# Patient Record
Sex: Female | Born: 1970 | Race: Black or African American | Hispanic: No | Smoking: Former smoker
Health system: Southern US, Community
[De-identification: ages and names within clinical notes are randomized; demographics above are authoritative.]

## PROBLEM LIST (undated history)

## (undated) DIAGNOSIS — F329 Major depressive disorder, single episode, unspecified: Secondary | ICD-10-CM

## (undated) DIAGNOSIS — Z5320 Procedure and treatment not carried out because of patient's decision for unspecified reasons: Secondary | ICD-10-CM

## (undated) DIAGNOSIS — N938 Other specified abnormal uterine and vaginal bleeding: Secondary | ICD-10-CM

## (undated) DIAGNOSIS — E663 Overweight: Secondary | ICD-10-CM

## (undated) DIAGNOSIS — D500 Iron deficiency anemia secondary to blood loss (chronic): Secondary | ICD-10-CM

## (undated) DIAGNOSIS — I10 Essential (primary) hypertension: Secondary | ICD-10-CM

## (undated) DIAGNOSIS — E785 Hyperlipidemia, unspecified: Secondary | ICD-10-CM

## (undated) DIAGNOSIS — Z87891 Personal history of nicotine dependence: Secondary | ICD-10-CM

## (undated) DIAGNOSIS — F32A Depression, unspecified: Secondary | ICD-10-CM

## (undated) HISTORY — DX: Iron deficiency anemia secondary to blood loss (chronic): D50.0

## (undated) HISTORY — DX: Personal history of nicotine dependence: Z87.891

## (undated) HISTORY — DX: Essential (primary) hypertension: I10

## (undated) HISTORY — DX: Procedure and treatment not carried out because of patient's decision for unspecified reasons: Z53.20

## (undated) HISTORY — DX: Overweight: E66.3

## (undated) HISTORY — DX: Hyperlipidemia, unspecified: E78.5

---

## 2015-10-02 DIAGNOSIS — N852 Hypertrophy of uterus: Secondary | ICD-10-CM | POA: Insufficient documentation

## 2015-10-02 DIAGNOSIS — N921 Excessive and frequent menstruation with irregular cycle: Secondary | ICD-10-CM | POA: Insufficient documentation

## 2015-12-31 ENCOUNTER — Inpatient Hospital Stay (HOSPITAL_COMMUNITY)
Admission: AD | Admit: 2015-12-31 | Discharge: 2016-01-04 | DRG: 065 | Disposition: A | Discharge status: Home Health | Payer: Medicaid Other | Source: Other Acute Inpatient Hospital | Attending: Internal Medicine | Admitting: Internal Medicine

## 2015-12-31 DIAGNOSIS — N938 Other specified abnormal uterine and vaginal bleeding: Secondary | ICD-10-CM | POA: Diagnosis present

## 2015-12-31 DIAGNOSIS — F329 Major depressive disorder, single episode, unspecified: Secondary | ICD-10-CM | POA: Diagnosis present

## 2015-12-31 DIAGNOSIS — I639 Cerebral infarction, unspecified: Secondary | ICD-10-CM | POA: Diagnosis present

## 2015-12-31 DIAGNOSIS — F17210 Nicotine dependence, cigarettes, uncomplicated: Secondary | ICD-10-CM | POA: Diagnosis present

## 2015-12-31 DIAGNOSIS — E876 Hypokalemia: Secondary | ICD-10-CM | POA: Diagnosis present

## 2015-12-31 DIAGNOSIS — R531 Weakness: Secondary | ICD-10-CM | POA: Diagnosis present

## 2015-12-31 DIAGNOSIS — D259 Leiomyoma of uterus, unspecified: Secondary | ICD-10-CM | POA: Diagnosis present

## 2015-12-31 DIAGNOSIS — R4701 Aphasia: Secondary | ICD-10-CM | POA: Diagnosis present

## 2015-12-31 DIAGNOSIS — G8191 Hemiplegia, unspecified affecting right dominant side: Secondary | ICD-10-CM | POA: Diagnosis present

## 2015-12-31 DIAGNOSIS — I6340 Cerebral infarction due to embolism of unspecified cerebral artery: Secondary | ICD-10-CM

## 2015-12-31 DIAGNOSIS — I10 Essential (primary) hypertension: Secondary | ICD-10-CM | POA: Diagnosis present

## 2015-12-31 DIAGNOSIS — E785 Hyperlipidemia, unspecified: Secondary | ICD-10-CM | POA: Diagnosis present

## 2015-12-31 DIAGNOSIS — I6789 Other cerebrovascular disease: Secondary | ICD-10-CM | POA: Diagnosis not present

## 2015-12-31 DIAGNOSIS — I671 Cerebral aneurysm, nonruptured: Secondary | ICD-10-CM | POA: Diagnosis present

## 2015-12-31 DIAGNOSIS — D500 Iron deficiency anemia secondary to blood loss (chronic): Secondary | ICD-10-CM | POA: Insufficient documentation

## 2015-12-31 DIAGNOSIS — R29704 NIHSS score 4: Secondary | ICD-10-CM | POA: Diagnosis present

## 2015-12-31 DIAGNOSIS — F4321 Adjustment disorder with depressed mood: Secondary | ICD-10-CM | POA: Diagnosis present

## 2015-12-31 DIAGNOSIS — I63412 Cerebral infarction due to embolism of left middle cerebral artery: Secondary | ICD-10-CM | POA: Diagnosis present

## 2015-12-31 DIAGNOSIS — D509 Iron deficiency anemia, unspecified: Secondary | ICD-10-CM | POA: Diagnosis present

## 2015-12-31 DIAGNOSIS — I63132 Cerebral infarction due to embolism of left carotid artery: Secondary | ICD-10-CM | POA: Diagnosis not present

## 2015-12-31 HISTORY — DX: Major depressive disorder, single episode, unspecified: F32.9

## 2015-12-31 HISTORY — DX: Depression, unspecified: F32.A

## 2015-12-31 HISTORY — DX: Other specified abnormal uterine and vaginal bleeding: N93.8

## 2015-12-31 LAB — CBC WITH DIFFERENTIAL/PLATELET
BASOS PCT: 0 %
Basophils Absolute: 0 10*3/uL (ref 0.0–0.1)
EOS ABS: 0.1 10*3/uL (ref 0.0–0.7)
EOS PCT: 1 %
HCT: 28.4 % — ABNORMAL LOW (ref 36.0–46.0)
HEMOGLOBIN: 8.3 g/dL — AB (ref 12.0–15.0)
LYMPHS PCT: 17 %
Lymphs Abs: 1.4 10*3/uL (ref 0.7–4.0)
MCH: 20.4 pg — AB (ref 26.0–34.0)
MCHC: 29.2 g/dL — AB (ref 30.0–36.0)
MCV: 69.8 fL — AB (ref 78.0–100.0)
Monocytes Absolute: 0.4 10*3/uL (ref 0.1–1.0)
Monocytes Relative: 5 %
NEUTROS ABS: 6.6 10*3/uL (ref 1.7–7.7)
NEUTROS PCT: 77 %
Platelets: 336 10*3/uL (ref 150–400)
RBC: 4.07 MIL/uL (ref 3.87–5.11)
RDW: 20 % — ABNORMAL HIGH (ref 11.5–15.5)
WBC: 8.5 10*3/uL (ref 4.0–10.5)

## 2015-12-31 LAB — TYPE AND SCREEN
ABO/RH(D): B POS
Antibody Screen: NEGATIVE

## 2015-12-31 LAB — RETICULOCYTES
RBC.: 4.07 MIL/uL (ref 3.87–5.11)
RETIC COUNT ABSOLUTE: 32.6 10*3/uL (ref 19.0–186.0)
RETIC CT PCT: 0.8 % (ref 0.4–3.1)

## 2015-12-31 LAB — COMPREHENSIVE METABOLIC PANEL
ALBUMIN: 3.2 g/dL — AB (ref 3.5–5.0)
ALK PHOS: 59 U/L (ref 38–126)
ALT: 14 U/L (ref 14–54)
ANION GAP: 6 (ref 5–15)
AST: 16 U/L (ref 15–41)
BUN: 7 mg/dL (ref 6–20)
CALCIUM: 8.4 mg/dL — AB (ref 8.9–10.3)
CHLORIDE: 108 mmol/L (ref 101–111)
CO2: 22 mmol/L (ref 22–32)
CREATININE: 0.66 mg/dL (ref 0.44–1.00)
GFR calc non Af Amer: >60 mL/min (ref >60)
GLUCOSE: 86 mg/dL (ref 65–99)
Potassium: 3.1 mmol/L — ABNORMAL LOW (ref 3.5–5.1)
SODIUM: 136 mmol/L (ref 135–145)
Total Bilirubin: 1.3 mg/dL — ABNORMAL HIGH (ref 0.3–1.2)
Total Protein: 6.5 g/dL (ref 6.5–8.1)

## 2015-12-31 LAB — PROTIME-INR
INR: 1.11 (ref 0.00–1.49)
PROTHROMBIN TIME: 14.5 s (ref 11.6–15.2)

## 2016-01-01 ENCOUNTER — Inpatient Hospital Stay (HOSPITAL_COMMUNITY): Payer: Medicaid Other

## 2016-01-01 ENCOUNTER — Encounter (HOSPITAL_COMMUNITY): Payer: Self-pay | Admitting: Internal Medicine

## 2016-01-01 DIAGNOSIS — D509 Iron deficiency anemia, unspecified: Secondary | ICD-10-CM

## 2016-01-01 DIAGNOSIS — I6349 Cerebral infarction due to embolism of other cerebral artery: Secondary | ICD-10-CM

## 2016-01-01 DIAGNOSIS — I6789 Other cerebrovascular disease: Secondary | ICD-10-CM

## 2016-01-01 DIAGNOSIS — I63412 Cerebral infarction due to embolism of left middle cerebral artery: Secondary | ICD-10-CM

## 2016-01-01 DIAGNOSIS — I639 Cerebral infarction, unspecified: Secondary | ICD-10-CM

## 2016-01-01 HISTORY — DX: Cerebral infarction, unspecified: I63.9

## 2016-01-01 HISTORY — DX: Cerebral infarction due to embolism of left middle cerebral artery: I63.412

## 2016-01-01 HISTORY — DX: Iron deficiency anemia, unspecified: D50.9

## 2016-01-01 LAB — ABO/RH: ABO/RH(D): B POS

## 2016-01-01 LAB — LIPID PANEL
CHOL/HDL RATIO: 3.1 ratio
Cholesterol: 149 mg/dL (ref 0–200)
HDL: 48 mg/dL (ref >40)
LDL Cholesterol: 78 mg/dL (ref 0–99)
Triglycerides: 115 mg/dL (ref <150)
VLDL: 23 mg/dL (ref 0–40)

## 2016-01-01 LAB — SEDIMENTATION RATE: Sed Rate: 8 mm/hr (ref 0–22)

## 2016-01-01 LAB — IRON AND TIBC
Iron: 78 ug/dL (ref 28–170)
SATURATION RATIOS: 15 % (ref 10.4–31.8)
TIBC: 532 ug/dL — AB (ref 250–450)
UIBC: 454 ug/dL

## 2016-01-01 LAB — GLUCOSE, CAPILLARY: Glucose-Capillary: 121 mg/dL — ABNORMAL HIGH (ref 65–99)

## 2016-01-01 LAB — URINALYSIS, ROUTINE W REFLEX MICROSCOPIC
Bilirubin Urine: NEGATIVE
GLUCOSE, UA: NEGATIVE mg/dL
Hgb urine dipstick: NEGATIVE
KETONES UR: 15 mg/dL — AB
LEUKOCYTES UA: NEGATIVE
NITRITE: NEGATIVE
PH: 6 (ref 5.0–8.0)
Protein, ur: 30 mg/dL — AB
SPECIFIC GRAVITY, URINE: 1.023 (ref 1.005–1.030)

## 2016-01-01 LAB — C-REACTIVE PROTEIN: CRP: 1 mg/dL — ABNORMAL HIGH (ref <1.0)

## 2016-01-01 LAB — PREGNANCY, URINE: Preg Test, Ur: NEGATIVE

## 2016-01-01 LAB — FERRITIN: Ferritin: 4 ng/mL — ABNORMAL LOW (ref 11–307)

## 2016-01-01 LAB — URINE MICROSCOPIC-ADD ON

## 2016-01-01 LAB — RAPID URINE DRUG SCREEN, HOSP PERFORMED
Amphetamines: NOT DETECTED
BENZODIAZEPINES: NOT DETECTED
Barbiturates: NOT DETECTED
Cocaine: NOT DETECTED
Opiates: NOT DETECTED
Tetrahydrocannabinol: NOT DETECTED

## 2016-01-01 LAB — FOLATE: FOLATE: 17.6 ng/mL (ref >5.9)

## 2016-01-01 LAB — ECHOCARDIOGRAM COMPLETE: WEIGHTICAEL: 2546.75 [oz_av]

## 2016-01-01 LAB — VITAMIN B12: VITAMIN B 12: 448 pg/mL (ref 180–914)

## 2016-01-01 IMAGING — CR DG CHEST 2V
2 series · 2 of 2 positions shown · non-contrast
Comparison: [DATE]

CLINICAL DATA: Weakness following CVA

EXAM:
CHEST  2 VIEW

[chest lat]
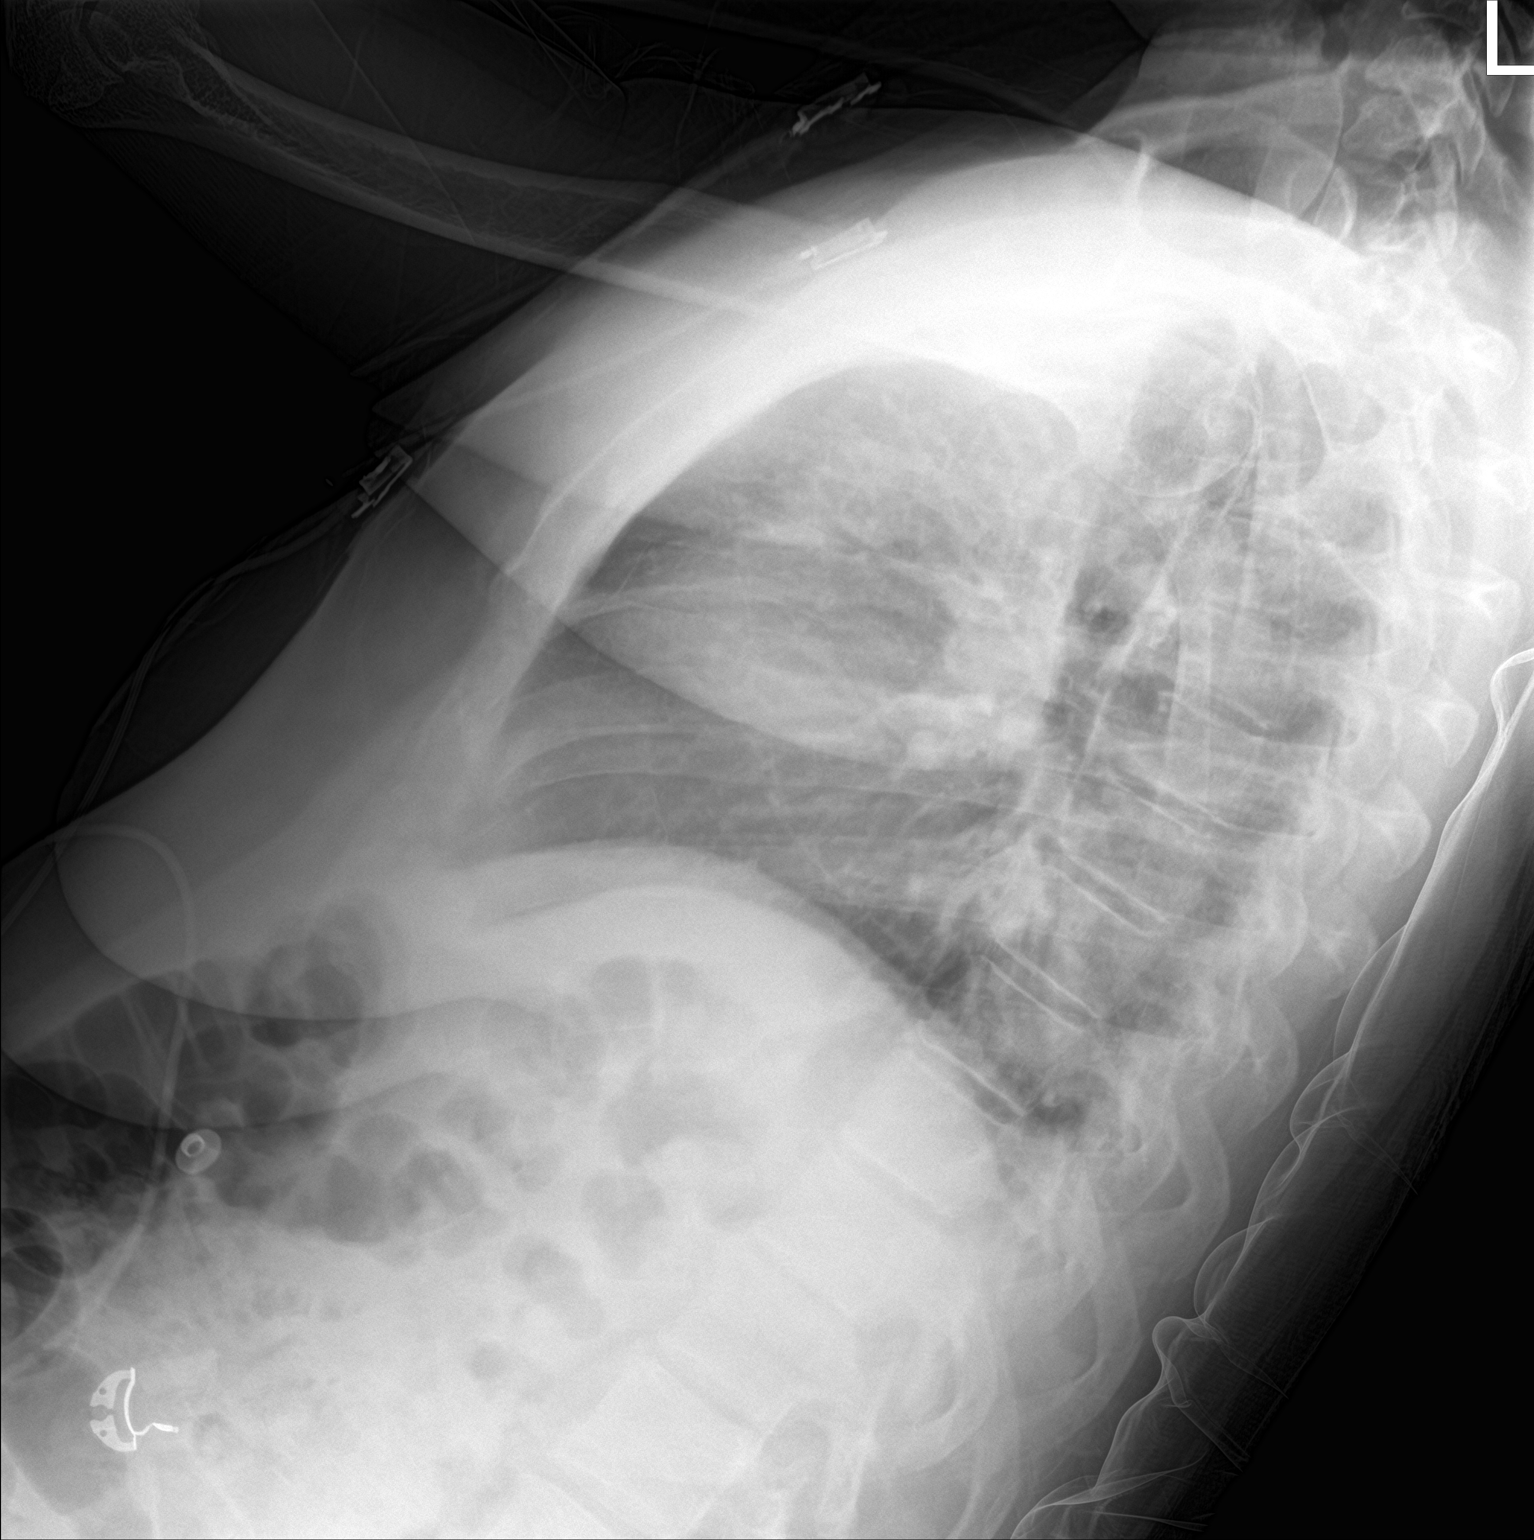

[chest ap]
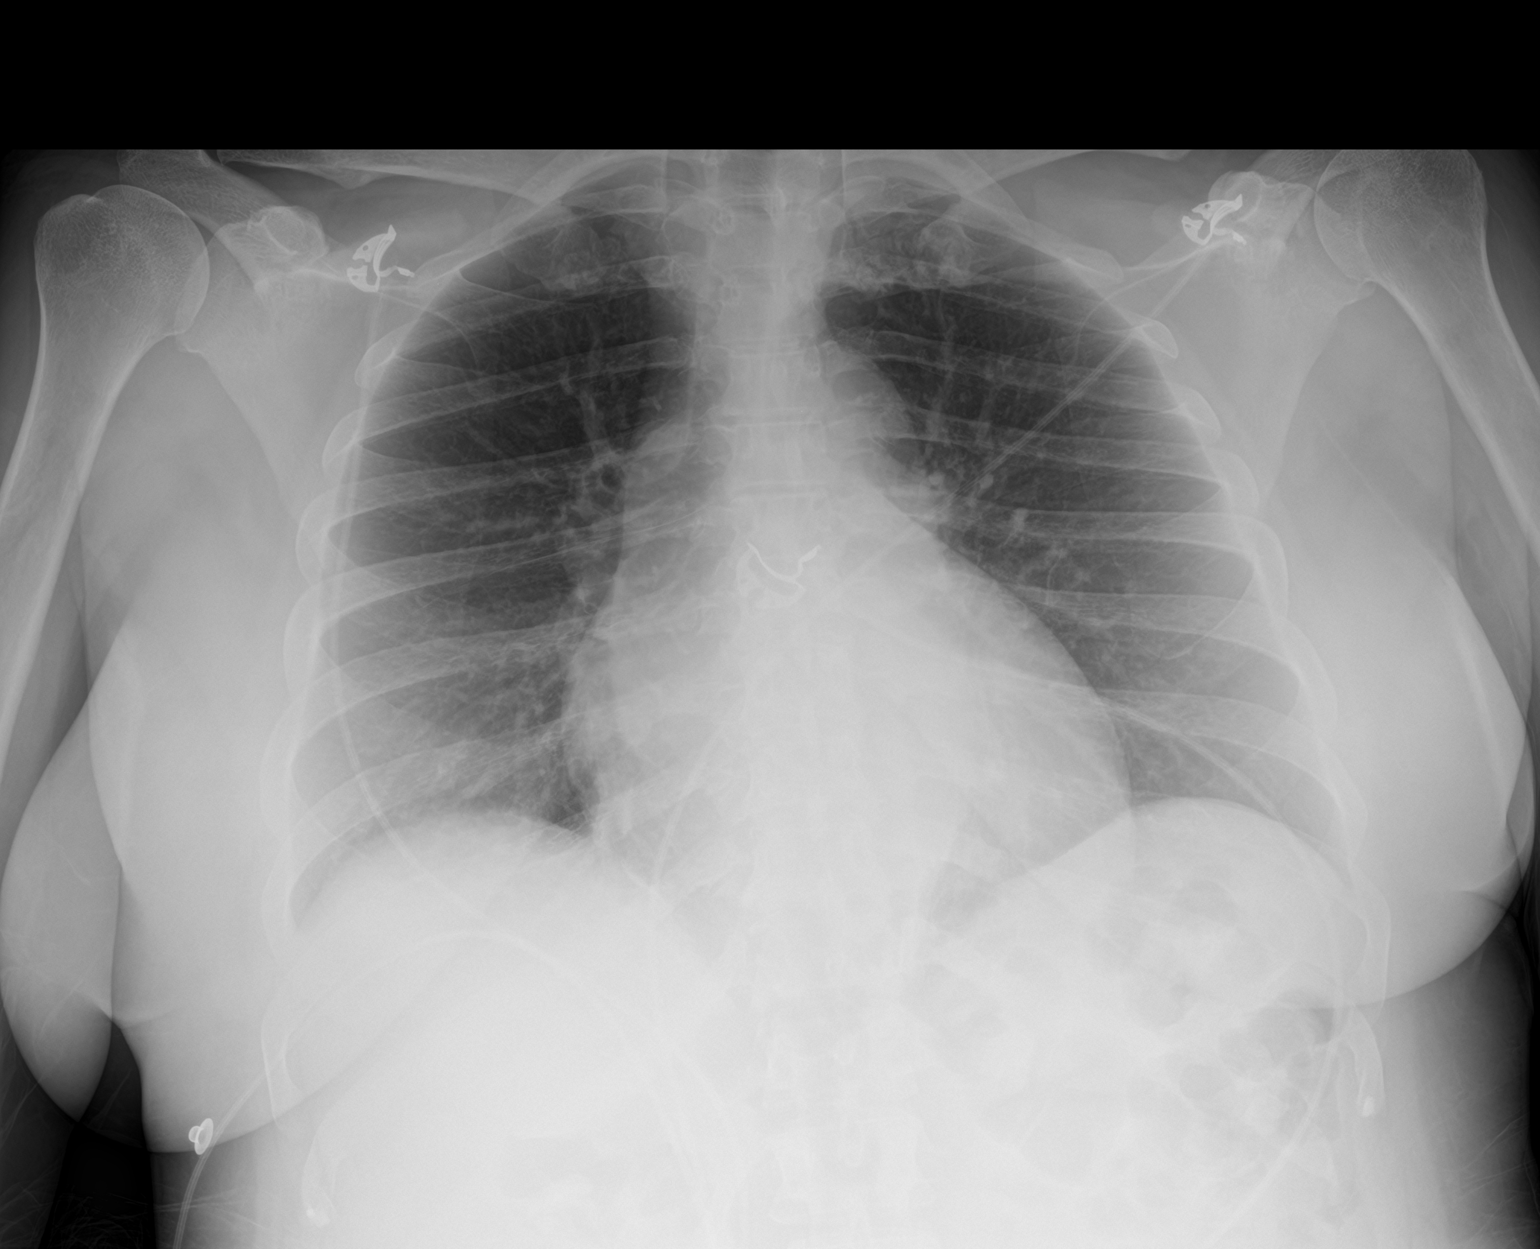

[2 of 2 positions shown; findings below may reference images not displayed]

FINDINGS: There is no edema or consolidation. Heart is upper normal in size
with pulmonary vascularity within normal limits. No adenopathy. No
bone lesions.
IMPRESSION: No edema or consolidation.

## 2016-01-01 MED ORDER — FERROUS SULFATE 325 (65 FE) MG PO TABS
325.0000 mg | ORAL_TABLET | Freq: Two times a day (BID) | ORAL | Status: DC
  Administered 2016-01-02 – 2016-01-04 (×5): 325 mg via ORAL
  Filled 2016-01-01 (×5): qty 1

## 2016-01-01 MED ORDER — IOPAMIDOL (ISOVUE-370) INJECTION 76%
INTRAVENOUS | Status: AC
Start: 2016-01-01 — End: 2016-01-01
  Administered 2016-01-01: 50 mL
  Filled 2016-01-01: qty 50

## 2016-01-01 MED ORDER — ATORVASTATIN CALCIUM 10 MG PO TABS
10.0000 mg | ORAL_TABLET | Freq: Every day | ORAL | Status: DC
  Administered 2016-01-01 – 2016-01-03 (×3): 10 mg via ORAL
  Filled 2016-01-01 (×3): qty 1

## 2016-01-01 MED ORDER — STROKE: EARLY STAGES OF RECOVERY BOOK
Freq: Once | Status: AC
  Administered 2016-01-01: 03:00:00

## 2016-01-01 MED ORDER — DOCUSATE SODIUM 100 MG PO CAPS
100.0000 mg | ORAL_CAPSULE | Freq: Two times a day (BID) | ORAL | Status: DC
Start: 2016-01-01 — End: 2016-01-04
  Administered 2016-01-01 – 2016-01-04 (×4): 100 mg via ORAL
  Filled 2016-01-01 (×5): qty 1

## 2016-01-01 MED ORDER — SODIUM CHLORIDE 0.9 % IV SOLN
INTRAVENOUS | Status: AC
  Administered 2016-01-01: 03:00:00 via INTRAVENOUS

## 2016-01-01 MED ORDER — ASPIRIN 325 MG PO TABS
325.0000 mg | ORAL_TABLET | Freq: Every day | ORAL | Status: DC
  Administered 2016-01-01 – 2016-01-04 (×4): 325 mg via ORAL
  Filled 2016-01-01 (×4): qty 1

## 2016-01-01 MED ORDER — HEPARIN (PORCINE) IN NACL 100-0.45 UNIT/ML-% IJ SOLN
1100.0000 [IU]/h | INTRAMUSCULAR | Status: DC
  Administered 2016-01-01: 900 [IU]/h via INTRAVENOUS
  Administered 2016-01-02: 1100 [IU]/h via INTRAVENOUS
  Filled 2016-01-01 (×3): qty 250

## 2016-01-01 MED ORDER — POTASSIUM CHLORIDE CRYS ER 20 MEQ PO TBCR
40.0000 meq | EXTENDED_RELEASE_TABLET | Freq: Once | ORAL | Status: AC
  Administered 2016-01-01: 40 meq via ORAL
  Filled 2016-01-01: qty 2

## 2016-01-01 MED ORDER — ASPIRIN 300 MG RE SUPP: 300.0000 mg | Freq: Every day | RECTAL | Status: DC
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
## 2016-01-01 NOTE — Progress Notes (Signed)
Pt arrived via [place] from [facility]. Pt drowsy but responds to voice. Patient has expressive aphasia but able to follow commands. Teley applied. Q2 neuro checks and vitals started. Pt oriented to room and call bell.

## 2016-01-01 NOTE — Progress Notes (Signed)
  Echocardiogram 2D Echocardiogram has been performed.  Bobbye Charleston 01/01/2016, 10:15 AM
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
## 2016-01-01 NOTE — Progress Notes (Signed)
[facility] has been requested to perform a transesophageal echocardiogram on [name] for stroke.  After careful review of history and examination, the risks and benefits of transesophageal echocardiogram have been explained including risks of esophageal damage, perforation (1:10,000 risk), bleeding, pharyngeal hematoma as well as other potential complications associated with conscious sedation including aspiration, arrhythmia, respiratory failure and death. Alternatives to treatment were discussed, questions were answered. Patient is willing to proceed.  TEE - Dr. Harrington Challenger  @ 14:00 on 01/02/16 . NPO after midnight. Meds with sips ok.   Arbutus Leas, NP 01/01/2016 4:06 PM
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
## 2016-01-01 NOTE — Progress Notes (Signed)
   01/01/16 1412  Clinical Encounter Type  Visited With Patient;Patient not available;Health care provider  Visit Type Initial  Referral From Nurse   Chaplain responded to a consult. Patient has just returned from CT and is tired. Chaplain offered and will try to return later this afternoon or tomorrow. Spiritual care services available as needed.   Jeri Lager, Chaplain  01/01/2016 2:13 PM
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
## 2016-01-01 NOTE — Progress Notes (Signed)
TEE information given. Consent signed and in chart. Pt to be NPO after MN.  Ave Filter, RN
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
## 2016-01-01 NOTE — Progress Notes (Signed)
   01/01/16 1601  Clinical Encounter Type  Visited With Patient and family together;Health care provider  Visit Type Follow-up  Referral From Nurse   Chaplain provided follow-up care. Patient is now awake and alert, but she is visiting with family. Chaplain introduced spiritual care services. Spiritual care services available as needed.   Jeri Lager, Chaplain 01/01/2016 4:02 PM

## 2016-01-02 ENCOUNTER — Encounter (HOSPITAL_COMMUNITY)
Admission: AD | Disposition: A | Discharge status: Home Health | Payer: Self-pay | Source: Other Acute Inpatient Hospital | Attending: Internal Medicine

## 2016-01-02 ENCOUNTER — Other Ambulatory Visit (HOSPITAL_COMMUNITY): Payer: Self-pay

## 2016-01-02 LAB — BASIC METABOLIC PANEL
Anion gap: 4 — ABNORMAL LOW (ref 5–15)
BUN: 9 mg/dL (ref 6–20)
CALCIUM: 8.4 mg/dL — AB (ref 8.9–10.3)
CHLORIDE: 107 mmol/L (ref 101–111)
CO2: 23 mmol/L (ref 22–32)
CREATININE: 0.72 mg/dL (ref 0.44–1.00)
GFR calc non Af Amer: >60 mL/min (ref >60)
GLUCOSE: 100 mg/dL — AB (ref 65–99)
Potassium: 3.5 mmol/L (ref 3.5–5.1)
Sodium: 134 mmol/L — ABNORMAL LOW (ref 135–145)

## 2016-01-02 LAB — CBC
HEMATOCRIT: 27.9 % — AB (ref 36.0–46.0)
Hemoglobin: 8.1 g/dL — ABNORMAL LOW (ref 12.0–15.0)
MCH: 20.1 pg — ABNORMAL LOW (ref 26.0–34.0)
MCHC: 29 g/dL — AB (ref 30.0–36.0)
MCV: 69.2 fL — AB (ref 78.0–100.0)
Platelets: 305 10*3/uL (ref 150–400)
RBC: 4.03 MIL/uL (ref 3.87–5.11)
RDW: 20.1 % — AB (ref 11.5–15.5)
WBC: 8.9 10*3/uL (ref 4.0–10.5)

## 2016-01-02 LAB — HIV ANTIBODY (ROUTINE TESTING W REFLEX): HIV SCREEN 4TH GENERATION: NONREACTIVE

## 2016-01-02 LAB — HEPARIN LEVEL (UNFRACTIONATED)
HEPARIN UNFRACTIONATED: 0.31 [IU]/mL (ref 0.30–0.70)
HEPARIN UNFRACTIONATED: 0.45 [IU]/mL (ref 0.30–0.70)
HEPARIN UNFRACTIONATED: 0.13 [IU]/mL — AB (ref 0.30–0.70)

## 2016-01-02 LAB — LUPUS ANTICOAGULANT PANEL
DRVVT: 32.4 s (ref 0.0–47.0)
PTT LA: 29.8 s (ref 0.0–51.9)

## 2016-01-02 LAB — CARDIOLIPIN ANTIBODIES, IGG, IGM, IGA: Anticardiolipin IgA: <9 APL U/mL (ref 0–11)

## 2016-01-02 LAB — MAGNESIUM: Magnesium: 1.9 mg/dL (ref 1.7–2.4)

## 2016-01-02 LAB — C3 COMPLEMENT: C3 Complement: 128 mg/dL (ref 82–167)

## 2016-01-02 LAB — COMPLEMENT, TOTAL: Compl, Total (CH50): >60 U/mL — ABNORMAL HIGH (ref 42–60)

## 2016-01-02 LAB — RPR: RPR: NONREACTIVE

## 2016-01-02 LAB — C4 COMPLEMENT: Complement C4, Body Fluid: 21 mg/dL (ref 14–44)

## 2016-01-02 SURGERY — ECHOCARDIOGRAM, TRANSESOPHAGEAL
Anesthesia: Moderate Sedation
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
## 2016-01-02 NOTE — Progress Notes (Signed)
STROKE TEAM PROGRESS NOTE    SUBJECTIVE (INTERVAL HISTORY) No family, no one is at the bedside.  She denies recent neck trauma, neck pain or other neck manipulation. No hx of VTE.    OBJECTIVE Temp:  [98.2 F (36.8 C)-100.1 F (37.8 C)] 98.2 F (36.8 C) (07/13 0943) Pulse Rate:  [56-102] 61 (07/13 0943) Cardiac Rhythm:  [-] Sinus bradycardia (07/13 0700) Resp:  [16-20] 20 (07/13 0943) BP: (143-175)/(55-93) 143/84 mmHg (07/13 0943) SpO2:  [98 %-100 %] 98 % (07/13 0943) Weight:  [69.9 kg (154 lb 1.6 oz)] 69.9 kg (154 lb 1.6 oz) (07/12 1505)  CBC:   Recent Labs Lab 12/31/15 2236 01/02/16 0024  WBC 8.5 8.9  NEUTROABS 6.6  --   HGB 8.3* 8.1*  HCT 28.4* 27.9*  MCV 69.8* 69.2*  PLT 336 [id number]    Basic Metabolic Panel:   Recent Labs Lab 12/31/15 2236 01/02/16 0024  NA 136 134*  K 3.1* 3.5  CL 108 107  CO2 22 23  GLUCOSE 86 100*  BUN 7 9  CREATININE 0.66 0.72  CALCIUM 8.4* 8.4*  MG  --  1.9    Lipid Panel:     Component Value Date/Time   CHOL 149 01/01/2016 0607   TRIG 115 01/01/2016 0607   HDL 48 01/01/2016 0607   CHOLHDL 3.1 01/01/2016 0607   VLDL 23 01/01/2016 0607   LDLCALC 78 01/01/2016 0607   HgbA1c: No results found for: HGBA1C Urine Drug Screen:     Component Value Date/Time   LABOPIA NONE DETECTED 01/01/2016 0543   COCAINSCRNUR NONE DETECTED 01/01/2016 0543   LABBENZ NONE DETECTED 01/01/2016 0543   AMPHETMU NONE DETECTED 01/01/2016 0543   THCU NONE DETECTED 01/01/2016 0543   LABBARB NONE DETECTED 01/01/2016 0543      IMAGING  Ct Angio Head W Or Wo Contrast  01/01/2016  CLINICAL DATA:  45 year old female with scattered acute infarcts in the left hemisphere suspected due to left MCA embolic phenomena. Initial encounter. EXAM: CT ANGIOGRAPHY HEAD AND NECK TECHNIQUE: Multidetector CT imaging of the head and neck was performed using the standard protocol during bolus administration of intravenous contrast. Multiplanar CT image reconstructions and  MIPs were obtained to evaluate the vascular anatomy. Carotid stenosis measurements (when applicable) are obtained utilizing NASCET criteria, using the distal internal carotid diameter as the denominator. CONTRAST:  50 mL Isovue 370. COMPARISON:  [facility] CTA head and neck 12/31/2015, and brain MRI 12/31/2015. FINDINGS: CT HEAD Brain: Scattered areas of hypodensity have developed since the noncontrast head CT on 12/31/2015 in the left MCA territory corresponding to the recent MRI diffusion findings. Mild regional mass effect. No midline shift. No associated hemorrhage. Elsewhere stable and normal gray-white matter differentiation. No ventriculomegaly. Calvarium and skull base: Stable and negative. Paranasal sinuses: Stable in clear. Orbits: Stable and negative, mildly dysconjugate gaze. CTA NECK Skeleton: Scattered poor dentition. No acute osseous abnormality identified. Other neck: Stable and negative lung apices and superior mediastinum. Stable and negative thyroid, larynx, pharynx, parapharyngeal spaces, retropharyngeal space, sublingual space, submandibular glands, and parotid glands. No cervical lymphadenopathy. Aortic arch: 3 vessel arch configuration re- demonstrated. Negative visualized thoracic aorta with no plaque or mural thrombus identified. Normal great vessel origins. Right carotid system: Stable and negative aside from minimal soft plaque along the posterior right ICA bulb. The cervical right ICA is tortuous. Left carotid system: Negative left CCA. At the left ICA origin and bulb there is new low-density thrombus adherent to the lateral wall (series 501,  image 65 and series 503 images 129-131). Subsequent stenosis is less than 50 % with respect to the distal vessel. 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 There is no superimposed mural irregularity of the vessel to  suggest fibromuscular dysplasia. Vertebral arteries:No proximal subclavian stenosis. Normal vertebral artery origins. Vertebral arteries are codominant and tortuous in the neck, but otherwise normal. CTA HEAD Posterior circulation: Codominant distal vertebral arteries are somewhat diminutive but without stenosis. Both PICA origins are stable and normal, occurring somewhat early at the cisterna magna. Stable basilar artery without stenosis. Normal SCA origins. Fetal type left PCA origin. Right posterior communicating artery also is present. Bilateral PCA branches are normal. Anterior circulation: Both ICA siphons are patent without irregularity or stenosis. Normal ophthalmic and posterior communicating artery origins. Patent carotid termini. Normal right MCA and ACA origins. The left A1 is non dominant. Anterior communicating artery and bilateral ACA branches are within normal limits. Right MCA M1 segment, bifurcation, and right MCA branches are normal. The left MCA M1 segment appears "Duplicated", with 2 origins off of the left ICA terminus as seen on series 505, image 17. Proximal left MCA branches are tortuous but patent. No left MCA branch occlusion is identified. Venous sinuses: Patent. Anatomic variants: "Duplicated" left MCA M1 segment. Fetal type left PCA origin. Dominant right A1 segment. Delayed phase: No abnormal enhancement identified. IMPRESSION: 1. Since this CTA yesterday there is new thrombus at the left ICA origin adherent to the bulb. See series 501, image 65. This and other findings on this exam were discussed by telephone with PA SHARON BIBY on 01/01/2016 at At 1345 hours. 2. However, the study remains negative for emergent large vessel occlusion. 3. 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 No other findings to suggest underlying FMD. Perhaps this is the sequelae of remote trauma. 4. Left MCA anatomic variation with  duplicated M1 appearance. No left MCA branch occlusion identified. 5. Expected evolution of the left MCA infarcts seen by MRI yesterday. No associated hemorrhage or mass effect. Electronically Signed   By: Genevie Ann M.D.   On: 01/01/2016 13:52   Dg Chest 2 View  01/01/2016  CLINICAL DATA:  Weakness following CVA EXAM: CHEST  2 VIEW COMPARISON:  December 31, 2015 FINDINGS: There is no edema or consolidation. Heart is upper normal in size with pulmonary vascularity within normal limits. No adenopathy. No bone lesions. IMPRESSION: No edema or consolidation. Electronically Signed   By: Lowella Grip III M.D.   On: 01/01/2016 07:59   Ct Angio Neck W Or Wo Contrast  01/01/2016  CLINICAL DATA:  45 year old female with scattered acute infarcts in the left hemisphere suspected due to left MCA embolic phenomena. Initial encounter. EXAM: CT ANGIOGRAPHY HEAD AND NECK TECHNIQUE: Multidetector CT imaging of the head and neck was performed using the standard protocol during bolus administration of intravenous contrast. Multiplanar CT image reconstructions and MIPs were obtained to evaluate the vascular anatomy. Carotid stenosis measurements (when applicable) are obtained utilizing NASCET criteria, using the distal internal carotid diameter as the denominator. CONTRAST:  50 mL Isovue 370. COMPARISON:  [facility] CTA head and neck 12/31/2015, and brain MRI 12/31/2015. FINDINGS: CT HEAD Brain: Scattered areas of hypodensity have developed since the noncontrast head CT on 12/31/2015 in the left MCA territory corresponding to the  recent MRI diffusion findings. Mild regional mass effect. No midline shift. No associated hemorrhage. Elsewhere stable and normal gray-white matter differentiation. No ventriculomegaly. Calvarium and skull base: Stable and negative. Paranasal sinuses: Stable in clear. Orbits: Stable and negative, mildly dysconjugate gaze. CTA NECK Skeleton: Scattered poor dentition. No acute osseous abnormality  identified. Other neck: Stable and negative lung apices and superior mediastinum. Stable and negative thyroid, larynx, pharynx, parapharyngeal spaces, retropharyngeal space, sublingual space, submandibular glands, and parotid glands. No cervical lymphadenopathy. Aortic arch: 3 vessel arch configuration re- demonstrated. Negative visualized thoracic aorta with no plaque or mural thrombus identified. Normal great vessel origins. Right carotid system: Stable and negative aside from minimal soft plaque along the posterior right ICA bulb. The cervical right ICA is tortuous. Left carotid system: Negative left CCA. At the left ICA origin and bulb there is new low-density thrombus adherent to the lateral wall (series 501, image 65 and series 503 images 129-131). Subsequent stenosis is less than 50 % with respect to the distal vessel. 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 There is no superimposed mural irregularity of the vessel to suggest fibromuscular dysplasia. Vertebral arteries:No proximal subclavian stenosis. Normal vertebral artery origins. Vertebral arteries are codominant and tortuous in the neck, but otherwise normal. CTA HEAD Posterior circulation: Codominant distal vertebral arteries are somewhat diminutive but without stenosis. Both PICA origins are stable and normal, occurring somewhat early at the cisterna magna. Stable basilar artery without stenosis. Normal SCA origins. Fetal type left PCA origin. Right posterior communicating artery also is present. Bilateral PCA branches are normal. Anterior circulation: Both ICA siphons are patent without irregularity or stenosis. Normal ophthalmic and posterior communicating artery origins. Patent carotid termini. Normal right MCA and ACA origins. The left A1 is non dominant. Anterior communicating artery and bilateral ACA branches are within  normal limits. Right MCA M1 segment, bifurcation, and right MCA branches are normal. The left MCA M1 segment appears "Duplicated", with 2 origins off of the left ICA terminus as seen on series 505, image 17. Proximal left MCA branches are tortuous but patent. No left MCA branch occlusion is identified. Venous sinuses: Patent. Anatomic variants: "Duplicated" left MCA M1 segment. Fetal type left PCA origin. Dominant right A1 segment. Delayed phase: No abnormal enhancement identified. IMPRESSION: 1. Since this CTA yesterday there is new thrombus at the left ICA origin adherent to the bulb. See series 501, image 65. This and other findings on this exam were discussed by telephone with PA SHARON BIBY on 01/01/2016 at At 1345 hours. 2. However, the study remains negative for emergent large vessel occlusion. 3. 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 No other findings to suggest underlying FMD. Perhaps this is the sequelae of remote trauma. 4. 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 No left MCA branch occlusion identified. 5. Expected evolution of the left MCA infarcts seen by MRI yesterday. No associated hemorrhage or mass effect. Electronically Signed   By: Genevie Ann M.D.   On: 01/01/2016 13:52   MRI Head [facility] 12/31/2015 at 10:21 AM) background normal brain, showing areas of acute infarction of the left middle cerebral artery territory consistent with acute embolic infarction. There is involvement of the left caudate, globus pallidus and scattered areas of cortical and subcortical brain in the frontal and parietal  region. No hemorrhage or mass effect at this time.  2D Echocardiogram  - Left ventricle: The cavity size was normal. There was mild  concentric hypertrophy. Systolic function was normal. The  estimated ejection fraction was in the range of 60% to 65%. Wall  motion was normal; there were no  regional wall motion  abnormalities. Doppler parameters are consistent with abnormal  left ventricular relaxation (grade 1 diastolic dysfunction).  There was no evidence of elevated ventricular filling pressure by  Doppler parameters. - Aortic valve: Trileaflet; normal thickness leaflets.  Transvalvular velocity was within the normal range. There was no  stenosis. There was no regurgitation. - Ascending aorta: The ascending aorta was normal in size. - Mitral valve: Structurally normal valve. There was no  regurgitation. - Right ventricle: The cavity size was normal. Wall thickness was  normal. Systolic function was normal. - Right atrium: The atrium was normal in size. - Tricuspid valve: There was trivial regurgitation. - Pulmonary arteries: Systolic pressure was within the normal  range. - Inferior vena cava: The vessel was normal in size. - Pericardium, extracardiac: There was no pericardial effusion.  Impressions:  - No cardiac source of emboli was indentified.  Physical exam: Exam: Gen: NAD              CV: RRR, no MRG. No Carotid Bruits. No peripheral edema, warm, nontender Eyes: Conjunctivae clear without exudates or hemorrhage  Neuro: Detailed Neurologic Exam  Speech:    No dysarthria or dysphasia Cognition:    The patient is oriented to person, cannot tell me the month or year and appears to have very mild receptive aphasia  Cranial Nerves:    The pupils are equal, round, and reactive to light. The fundi are normal and spontaneous venous pulsations are present. Visual fields are full to finger confrontation. Extraocular movements are intact. She has mild eye proptosis. Trigeminal sensation is intact and the muscles of mastication are normal. The face is symmetric. The palate elevates in the midline. Hearing intact. Voice is normal. Shoulder shrug is normal. The tongue has normal motion without fasciculations.   Coordination:    No dysmetria  Motor  Observation:    No asymmetry, no atrophy, and no involuntary movements noted. Tone:    Normal muscle tone.    Posture:    Posture is normal. normal erect    Strength: Right arm and leg weakness 4 out of 5 otherwise strength is V/V in the upper and lower limbs.      Sensation: intact to LT, denies any sensory changes     ASSESSMENT/PLAN Kaitlyn Gilbert is a 45 y.o. female with history of anemia from dysfunctional uterine bleeding presenting with right-sided weakness, speech difficulty. She did not receive IV t-PA due to severe anemia.   Stroke:  Dominant left MCA infarcts, embolic secondary to unknown source. Suspicious for atrial fibrillation caused by stress.  Resultant mild right hemiparesis and possibly mild receptive aphasia  MRI  L MCA (left caudate, globus pallidus and scattered cortical and subcortical frontal and parietal infarcts)  CT anterior head and neck acute thrombus left carotid bulb. 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 There was mild  concentric hypertrophy. Systolic function was normal. The  estimated ejection fraction was in the range of 60% to 65%. Wall  motion was normal; there were no regional wall motion   abnormalities.  No need for TEE given  ICA thrombus. I discontinued after discussing with Leonie Man. Reordered diet. Explained to pt. Called cardiology. .   New thrombus was found on CTA at the left carotid bulb. Patient started on heparin drip. We'll transition to warfarin.  No loop planned at this time. To discuss with Leonie Man tomorrow.  Check TCD bubble study to look for PFO  Check LE venous dopplers  For DVT  Hypercoagulable and vasculitis labs ordered  LDL 78  HgbA1c pending  SCDs for VTE prophylaxis Diet NPO time specified  aspirin 325 mg daily prior to admission per patient (she was not sure why she is on it), now on aspirin 325 mg daily  Patient  counseled to be compliant with her antithrombotic medications  Ongoing aggressive stroke risk factor management  Therapy recommendations: none  Disposition: Home(  L ICA fusiform aneurysm  Incidental finding  Hypertension  Stable As high as 161/90, now in upper 140s Permissive hypertension (OK if < 220/120) but gradually normalize in 5-7 days Long-term BP goal normotensive  Hyperlipidemia  Home meds:  No statin  LDL 78, goal < 70  Added statin  Continue statin at discharge  Other Stroke Risk Factors  Cigarette smoker, advised to stop smoking  Other Active Problems  Dysfunctional uterine bleeding  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 I have made any additions or clarifications directly to the above note. Agree with note above. She presented with aphasia due to left MCA embolic infarct likely from proximal left carotid bifurcation clot. Etiology of clot is uncertain. Patient denies any history suggestive of neck, or dissection. Plan to check lower extremity venous Dopplers for DVT and TCD bubble study for PFO. Cancel TEE. The patient remains at significant risk for recurrent clots, strokes, TIAs and needs short-term anticoagulation until the clot resolves. Greater than 50% time during this 35 minute visit was spent on counseling and coordination of care about stroke risk, prevention and treatment and answering questions  Antony Contras, [place] Pager: [phone] 01/02/2016 5:14 PM   To contact Stroke Continuity provider, please refer to [email or web address] After hours, contact General Neurology

## 2016-01-02 NOTE — Care Management Note (Signed)
Case Management Note  Patient Details  Name: Kaitlyn Gilbert MRN: [id number] Date of Birth: [birth year]  Subjective/Objective:     Pt without insurance or PCP.                Action/Plan: CM met with the patient to discuss after hospital follow up. Pt interested in the [facility] in [place], however they do not schedule appointments. The patient has to go to the clinic and fill out an application and then they will be scheduled an appointment. Pt willing to do this but also interested in the [facility] since she will have follow up with neurology in this area. CM was able to obtain her an appointment at the [facility] (doing overflow for [facility]) in September. CM also informed her of the use of the pharmacy to assist her with her medications. CM instructed her that if she changed her mind and decided to try the [facility] to please call and cancel her appointment at the [facility]. Pt in agreement. CM will continue to follow for discharge needs. Expected Discharge Date:                  Expected Discharge Plan:     In-House Referral:     Discharge planning Services     Post Acute Care Choice:    Choice offered to:     DME Arranged:    DME Agency:     HH Arranged:    HH Agency:     Status of Service:     If discussed at [employer], dates discussed:    Additional Comments:  Pollie Friar, RN 01/02/2016, 2:00 PM
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
## 2016-01-03 ENCOUNTER — Inpatient Hospital Stay (HOSPITAL_COMMUNITY): Payer: Medicaid Other

## 2016-01-03 DIAGNOSIS — I639 Cerebral infarction, unspecified: Secondary | ICD-10-CM

## 2016-01-03 DIAGNOSIS — I63132 Cerebral infarction due to embolism of left carotid artery: Secondary | ICD-10-CM

## 2016-01-03 DIAGNOSIS — D500 Iron deficiency anemia secondary to blood loss (chronic): Secondary | ICD-10-CM | POA: Insufficient documentation

## 2016-01-03 LAB — CBC
HEMATOCRIT: 27.1 % — AB (ref 36.0–46.0)
HEMOGLOBIN: 7.9 g/dL — AB (ref 12.0–15.0)
MCH: 20.7 pg — ABNORMAL LOW (ref 26.0–34.0)
MCHC: 29.2 g/dL — ABNORMAL LOW (ref 30.0–36.0)
MCV: 70.9 fL — ABNORMAL LOW (ref 78.0–100.0)
Platelets: 265 10*3/uL (ref 150–400)
RBC: 3.82 MIL/uL — AB (ref 3.87–5.11)
RDW: 21 % — ABNORMAL HIGH (ref 11.5–15.5)
WBC: 5.7 10*3/uL (ref 4.0–10.5)

## 2016-01-03 LAB — HEPARIN LEVEL (UNFRACTIONATED): Heparin Unfractionated: 0.33 IU/mL (ref 0.30–0.70)

## 2016-01-03 LAB — ANTINUCLEAR ANTIBODIES, IFA: ANA Ab, IFA: NEGATIVE

## 2016-01-03 LAB — HEMOGLOBIN A1C
Hgb A1c MFr Bld: 5.1 % (ref 4.8–5.6)
MEAN PLASMA GLUCOSE: 100 mg/dL

## 2016-01-03 MED ORDER — APIXABAN 5 MG PO TABS
5.0000 mg | ORAL_TABLET | Freq: Two times a day (BID) | ORAL | Status: DC
  Administered 2016-01-04: 5 mg via ORAL
  Filled 2016-01-03: qty 1
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
## 2016-01-03 NOTE — Progress Notes (Signed)
*  PRELIMINARY RESULTS* Vascular Ultrasound Lower extremity venous duplex has been completed.  Preliminary findings: No evidence of DVT or baker's cyst.   Landry Mellow, RDMS, RVT  01/03/2016, 3:33 PM

## 2016-01-03 NOTE — Progress Notes (Signed)
PT Cancellation Note  Patient Details Name: Kaitlyn Gilbert MRN: [id number] DOB: 04-Nov-1970   Cancelled Treatment:    Reason Eval/Treat Not Completed: Patient at procedure or test/unavailable PT will check on pt later as time allows.    Salina April, PTA Pager: [phone]   01/03/2016, 3:16 PM
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
## 2016-01-03 NOTE — Care Management Note (Signed)
Case Management Note  Patient Details  Name: [name] MRN: [id number] Date of Birth: Apr 20, 1971  Subjective/Objective:                    Action/Plan: Plan is for patient to be discharged home later today with orders for [facility] services. CM informed Butch Penny with [facility] since the patient does not have insurance and needs to be worked up for charity. Dr Algis Liming stated patient would be changed over from Heparin to Eliquis. CM also met with the patient and her family and provided her with the Eliquis 30 day free card and the $10 copay card. Since patient may be a late discharge CM went over the [facility] letter with her and placed it on the front of her chart. Dr Algis Liming aware patient will need paper prescriptions at discharge. Bedside RN made aware of the need for the patient to receive the [facility] letter at discharge. Pt and family verbalized understanding. Pt will follow up with [facility] after discharge. Bedside RN updated on all of above.   Expected Discharge Date:                  Expected Discharge Plan:     In-House Referral:     Discharge planning Services     Post Acute Care Choice:    Choice offered to:     DME Arranged:    DME Agency:     HH Arranged:    HH Agency:     Status of Service:     If discussed at [employer], dates discussed:    Additional Comments:  Pollie Friar, RN 01/03/2016, 4:38 PM

## 2016-01-03 NOTE — Progress Notes (Signed)
Transcranial Doppler Bubble study has been completed.  Verbal informed consent taken and risks/ benefits explained.  Performed by Dr. Leonie Man.  Right hand IV was used and Right MCA insonated for test.  No HITS heard during rest or Valsalva. No apparent PFO.     Landry Mellow, RDMS, RVT 01/03/2016

## 2016-01-04 LAB — CBC
HEMATOCRIT: 27.9 % — AB (ref 36.0–46.0)
Hemoglobin: 8 g/dL — ABNORMAL LOW (ref 12.0–15.0)
MCH: 20.4 pg — ABNORMAL LOW (ref 26.0–34.0)
MCHC: 28.7 g/dL — AB (ref 30.0–36.0)
MCV: 71.2 fL — ABNORMAL LOW (ref 78.0–100.0)
Platelets: 259 10*3/uL (ref 150–400)
RBC: 3.92 MIL/uL (ref 3.87–5.11)
RDW: 21.3 % — AB (ref 11.5–15.5)
WBC: 6.7 10*3/uL (ref 4.0–10.5)

## 2016-01-04 MED ORDER — APIXABAN 5 MG PO TABS: 5.0000 mg | ORAL_TABLET | Freq: Two times a day (BID) | ORAL | Status: DC

## 2016-01-04 MED ORDER — DOCUSATE SODIUM 100 MG PO CAPS: 100.0000 mg | ORAL_CAPSULE | Freq: Two times a day (BID) | ORAL | Status: DC

## 2016-01-04 MED ORDER — FERROUS SULFATE 325 (65 FE) MG PO TABS: 325.0000 mg | ORAL_TABLET | Freq: Three times a day (TID) | ORAL | Status: DC

## 2016-01-04 MED ORDER — ATORVASTATIN CALCIUM 10 MG PO TABS: 10.0000 mg | ORAL_TABLET | Freq: Every day | ORAL | Status: DC

## 2016-01-04 NOTE — Progress Notes (Signed)
Pt being discharged per orders from MD. Pt educated on discharge instructions. Pt verbalized understanding of discharge instructions. All questions and concerns were addressed. Pt's IV was removed before discharge. Pt exited hospital via wheelchair. Match paper was given to pt before discharge.
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
## 2016-01-04 NOTE — Discharge Instructions (Addendum)
Information on my medicine - ELIQUIS (apixaban)  This medication education was reviewed with me or my healthcare representative as part of my discharge preparation.  Why was Eliquis prescribed for you? Eliquis was prescribed for you to reduce the risk of a blood clot forming that can cause a stroke if you have a medical condition called atrial fibrillation (a type of irregular heartbeat).  What do You need to know about Eliquis ? Take your Eliquis TWICE DAILY - one tablet in the morning and one tablet in the evening with or without food. If you have difficulty swallowing the tablet whole please discuss with your pharmacist how to take the medication safely.  Take Eliquis exactly as prescribed by your doctor and DO NOT stop taking Eliquis without talking to the doctor who prescribed the medication.  Stopping may increase your risk of developing a stroke.  Refill your prescription before you run out.  After discharge, you should have regular check-up appointments with your healthcare provider that is prescribing your Eliquis.  In the future your dose may need to be changed if your kidney function or weight changes by a significant amount or as you get older.  What do you do if you miss a dose? If you miss a dose, take it as soon as you remember on the same day and resume taking twice daily.  Do not take more than one dose of ELIQUIS at the same time to make up a missed dose.  Important Safety Information A possible side effect of Eliquis is bleeding. You should call your healthcare provider right away if you experience any of the following: ? Bleeding from an injury or your nose that does not stop. ? Unusual colored urine (red or dark brown) or unusual colored stools (red or black). ? Unusual bruising for unknown reasons. ? A serious fall or if you hit your head (even if there is no bleeding).  Some medicines may interact with Eliquis and might increase your risk of bleeding or  clotting while on Eliquis. To help avoid this, consult your healthcare provider or pharmacist prior to using any new prescription or non-prescription medications, including herbals, vitamins, non-steroidal anti-inflammatory drugs (NSAIDs) and supplements.  This website has more information on Eliquis (apixaban): http://www.eliquis.com/eliquis/home   Ischemic Stroke Treated Without Warfarin An ischemic stroke (cerebrovascular accident) is the sudden death of brain tissue. It is a medical emergency. An ischemic stroke can cause permanent loss of brain function. This can cause problems with different parts of your body. CAUSES An ischemic stroke is caused by a decrease of oxygen supply to an area of your brain. It is usually the result of a small blood clot (embolus) or collection of cholesterol or fat (plaque) that blocks blood flow in the brain. An ischemic stroke can also be caused by blocked or damaged carotid arteries. RISK FACTORS  High blood pressure (hypertension).  High cholesterol.  Diabetes mellitus.  Heart disease.  The buildup of plaque in the blood vessels (peripheral artery disease or atherosclerosis).  The buildup of plaque in the blood vessels that provide blood and oxygen to the brain (carotid artery stenosis).  An abnormal heart rhythm (atrial fibrillation).  Obesity.  Smoking cigarettes.  Taking oral contraceptives, especially in combination with using tobacco.  Physical inactivity.  A diet that is high in fats, salt (sodium), and calories.  Excessive alcohol use.  Use of illegal drugs, especially cocaine and methamphetamine.  Being African American.  Being over the age of 63 years.  Family history of stroke.  Previous history of blood clots, stroke, TIA (transient ischemic attack), or heart attack.  Sickle cell disease. SIGNS AND SYMPTOMS These symptoms usually develop suddenly, or you may notice them after waking up from sleep. Symptoms may  include sudden:  Weakness or numbness in your face, arm, or leg, especially on one side of your body.  Confusion.  Trouble speaking (aphasia) or understanding speech.  Trouble seeing with one or both eyes.  Trouble walking or difficulty moving your arms or legs.  Dizziness.  Loss of balance or coordination.  Severe headache with no known cause. The headache is often described as the worst headache ever experienced. DIAGNOSIS Your health care provider can often determine the presence or absence of an ischemic stroke based on your symptoms, history, and physical exam. CT (computed tomography) of the brain is usually performed to confirm the stroke, determine causes, and determine stroke severity. Other tests may be done to find the cause of the stroke. These tests may include:  ECG (electrocardiogram).  Continuous heart monitoring.  Echocardiogram.  Carotid ultrasound.  MRI.  A scan of the brain circulation.  Blood tests. TREATMENT It is very important to seek treatment at the first sign of stroke symptoms. Your health care provider may perform the following treatments within 6 hours of the onset of stroke symptoms:  Medicine to dissolve the blood clot (thrombolytic).  Inserting a device into the affected artery to remove the blood clot. These treatments may not be effective if too much time has passed since your stroke symptoms began. Even if you do not know when your symptoms began, get treatment as soon as possible. There are other treatment options that may be given, such as:  Oxygen.  IV fluids.  Medicines to thin the blood (anticoagulants).  A procedure to widen blocked arteries. Your treatment will depend on how long you have had your symptoms, the severity of your symptoms, and the cause of your symptoms. Your health care provider will take measures to prevent short-term and long-term complications of stroke, such as:  Breathing foreign material into the lungs  (aspiration pneumonia).  Blood clots in the legs.  Bedsores.  Falls. Medicines and dietary changes may be used to help treat and manage risk factors for stroke, such as diabetes and high blood pressure. If any of your body's functions were impaired by stroke, you may work with physical, speech, or occupational therapists to help you recover. HOME CARE INSTRUCTIONS  Take medicines only as directed by your health care provider. Follow the directions carefully. Medicines may be used to control risk factors for a stroke. Be sure that you understand all your medicine instructions.  If swallow studies have determined that your swallowing reflex is present, you should eat healthy foods. Foods may need to be a soft or pureed consistency, or you may need to take small bites in order to avoid aspirating or choking.  Follow physical activity guidelines as directed by your health care team.  Do not use any tobacco products, including cigarettes, chewing tobacco, or electronic cigarettes. If you smoke, quit. If you need help quitting, ask your health care provider.  Limit or stop alcohol use.  A safe home environment is important to reduce the risk of falls. Your health care provider may arrange for specialists to evaluate your home. Having grab bars in the bedroom and bathroom is often important. Your health care provider may arrange for equipment to be used at home, such as raised toilets  and a seat for the shower.  Ongoing physical, occupational, and speech therapy may be needed to maximize your recovery after a stroke. If you have been advised to use a walker or a cane, use it at all times. Be sure to keep your therapy appointments.  Keep all follow-up visits with your health care provider. This is very important. This includes any referrals, therapy, rehabilitation, and lab tests. Proper follow-up can prevent another stroke from occurring. PREVENTION The risk of a stroke can be decreased by  appropriately treating high blood pressure, high cholesterol, diabetes, heart disease, and obesity. It can also be decreased by quitting smoking, limiting alcohol, and staying physically active. SEEK IMMEDIATE MEDICAL CARE IF:  You have sudden weakness or numbness in your face, arm, or leg, especially on one side of your body.  You have sudden confusion.  You have sudden trouble speaking (aphasia) or understanding.  You have sudden trouble seeing with one or both eyes.  You have sudden trouble walking or difficulty moving your arms or legs.  You have sudden dizziness.  You have a sudden loss of balance or coordination.  You have a sudden, severe headache with no known cause.  You have a partial or total loss of consciousness. Any of these symptoms may represent a serious problem that is an emergency. Do not wait to see if the symptoms will go away. Get medical help right away. Call your local emergency services (911 in [place] Do not drive yourself to the hospital.   This information is not intended to replace advice given to you by your health care provider. Make sure you discuss any questions you have with your health care provider.   Document Released: 03/23/2014 Document Reviewed: 03/23/2014 Elsevier Interactive Patient Education [employer].

## 2016-01-10 ENCOUNTER — Telehealth: Payer: Self-pay | Admitting: *Deleted

## 2016-01-10 NOTE — Telephone Encounter (Signed)
/  Mary from [facility] called to receive a VO for one more PT visit and one OT visit./ VO was given. Stanton Kidney also stated prior to PT patients pressure was 160/100. Patient was instructed to relax and the pressure was rechecked which revealed 155/93. Patient pressure will be checked by nurse, PT and OT during visits. Patient has appointment on 03/09/16 to establish care.

## 2016-01-28 ENCOUNTER — Telehealth: Payer: Self-pay

## 2016-01-28 NOTE — Telephone Encounter (Signed)
Jolie, from [facility] called to report patients bp being high today during visit. bp was 148/98. She wanted to make [place] aware. Patient has NOT established care here yet but has an appointment to do so on 03/09/2016. Thanks!

## 2016-02-19 ENCOUNTER — Telehealth: Payer: Self-pay | Admitting: Neurology

## 2016-02-19 ENCOUNTER — Other Ambulatory Visit: Payer: Self-pay | Admitting: Neurology

## 2016-02-19 MED ORDER — ATORVASTATIN CALCIUM 10 MG PO TABS: 10.0000 mg | ORAL_TABLET | Freq: Every day | ORAL | 1 refills | Status: DC

## 2016-02-19 MED ORDER — APIXABAN 5 MG PO TABS: 5.0000 mg | ORAL_TABLET | Freq: Two times a day (BID) | ORAL | 1 refills | Status: DC

## 2016-02-19 NOTE — Telephone Encounter (Signed)
Ok will do so 

## 2016-02-19 NOTE — Telephone Encounter (Signed)
Pt was seen in the hospital for stroke on 7/11. She does not have PCP. She will need refill for apixaban (ELIQUIS) 5 MG TABS tablet and atorvastatin (LIPITOR) 10 MG tablet to [facility] in [place]

## 2016-02-20 NOTE — Telephone Encounter (Signed)
Prescription fax to patients pharmacy [facility] on [address]. Dr. Leonie Man did refills until she sees her new PCP.

## 2016-03-09 ENCOUNTER — Encounter: Payer: Self-pay | Admitting: Family Medicine

## 2016-03-09 ENCOUNTER — Ambulatory Visit (INDEPENDENT_AMBULATORY_CARE_PROVIDER_SITE_OTHER): Payer: Self-pay | Admitting: Family Medicine

## 2016-03-09 VITALS — BP 168/101 | HR 67 | Temp 98.4°F | Resp 14 | Ht 62.0 in | Wt 155.0 lb

## 2016-03-09 DIAGNOSIS — Z7189 Other specified counseling: Secondary | ICD-10-CM

## 2016-03-09 DIAGNOSIS — Z7689 Persons encountering health services in other specified circumstances: Secondary | ICD-10-CM

## 2016-03-09 DIAGNOSIS — D649 Anemia, unspecified: Secondary | ICD-10-CM

## 2016-03-09 MED ORDER — LISINOPRIL 20 MG PO TABS: 20.0000 mg | ORAL_TABLET | Freq: Every day | ORAL | 3 refills | Status: DC

## 2016-03-09 NOTE — Patient Instructions (Signed)
Follow-up with neurologist as planned

## 2016-03-11 NOTE — Progress Notes (Signed)
Kaitlyn Gilbert, is a 45 y.o. female  [id number]  [id number]  DOB - September 21, 1970  CC:  Chief Complaint  Patient presents with  . Establish Care  . Follow-up    stroke        HPI: Kaitlyn Gilbert is a 45 y.o. female here to establish care. She has a history of a CVA, with no residual deficits; anemia and depression. She is on Eliquis and Lipitor per her neurologist. She also has a history of hypertension but is currently on no medication. Her BP today is 176/91. She had a screening A1C in July of 5.1. She reports a mammogram in 2016 and a PAP in 2017.  She declines a flu shot today. She denies tobacco, alcohol or illicit drug use. She reports a regular diet and walking daily.  No Known Allergies Past Medical History:  Diagnosis Date  . Depression   . DUB (dysfunctional uterine bleeding)    Current Outpatient Prescriptions on File Prior to Visit  Medication Sig Dispense Refill  . apixaban (ELIQUIS) 5 MG TABS tablet Take 1 tablet (5 mg total) by mouth 2 (two) times daily. 60 tablet 1  . atorvastatin (LIPITOR) 10 MG tablet Take 1 tablet (10 mg total) by mouth daily at 6 PM. 30 tablet 1  . docusate sodium (COLACE) 100 MG capsule Take 1 capsule (100 mg total) by mouth 2 (two) times daily. 60 capsule 1  . ferrous sulfate 325 (65 FE) MG tablet Take 1 tablet (325 mg total) by mouth 3 (three) times daily with meals. 90 tablet 1   No current facility-administered medications on file prior to visit.    Family History  Problem Relation Age of Onset  . Hypertension Mother   . Cancer Maternal Grandmother    Social History   Social History  . Marital status: Married    Spouse name: N/A  . Number of children: N/A  . Years of education: N/A   Occupational History  . Not on file.   Social History Main Topics  . Smoking status: Former Smoker    Quit date: 02/07/2016  . Smokeless tobacco: Never Used  . Alcohol use No  . Drug use: No  . Sexual activity: Not on file   Other Topics  Concern  . Not on file   Social History Narrative  . No narrative on file    Review of Systems: Constitutional: Negative Skin: Negative HENT: Negative  Eyes: Negative  Neck: Negative Respiratory: Negative Cardiovascular: Negative Gastrointestinal: Negative Genitourinary: Negative  Musculoskeletal: Negative   Neurological: Negative for Hematological: Negative  Psychiatric/Behavioral: Self reported depression   Objective:   Vitals:   03/09/16 0956 03/09/16 1032  BP: (!) 176/91 (!) 168/101  Pulse: 66 67  Resp: 14   Temp: 98.4 F (36.9 C)     Physical Exam: Constitutional: Patient appears well-developed and well-nourished. No distress. HENT: Normocephalic, atraumatic, External right and left ear normal. Oropharynx is clear and moist.  Eyes: Conjunctivae and EOM are normal. PERRLA, no scleral icterus. Neck: Normal ROM. Neck supple. No lymphadenopathy, No thyromegaly. CVS: RRR, S1/S2 +, no murmurs, no gallops, no rubs Pulmonary: Effort and breath sounds normal, no stridor, rhonchi, wheezes, rales.  Abdominal: Soft. Normoactive BS,, no distension, tenderness, rebound or guarding.  Musculoskeletal: Normal range of motion. No edema and no tenderness.  Neuro: Alert.Normal muscle tone coordination. Non-focal Skin: Skin is warm and dry. No rash noted. Not diaphoretic. No erythema. No pallor. Psychiatric: Normal mood and affect. Behavior, judgment, thought content  normal.  Lab Results  Component Value Date   WBC 6.7 01/04/2016   HGB 8.0 (L) 01/04/2016   HCT 27.9 (L) 01/04/2016   MCV 71.2 (L) 01/04/2016   PLT 259 01/04/2016   Lab Results  Component Value Date   CREATININE 0.72 01/02/2016   BUN 9 01/02/2016   NA 134 (L) 01/02/2016   K 3.5 01/02/2016   CL 107 01/02/2016   CO2 23 01/02/2016    Lab Results  Component Value Date   HGBA1C 5.1 01/01/2016   Lipid Panel     Component Value Date/Time   CHOL 149 01/01/2016 0607   TRIG 115 01/01/2016 0607   HDL 48  01/01/2016 0607   CHOLHDL 3.1 01/01/2016 0607   VLDL 23 01/01/2016 0607   LDLCALC 78 01/01/2016 0607       Assessment and plan:   1. Encounter to establish care -Have reviewed information presented by the patient and pertinent information in her chart.  2. Anemia, unspecified anemia type - We failed to draw CBC today. Will have her return for this  3. Hypertension -lisinopril 20, #90, one po q day with one refill.   Return in about 3 months (around 06/08/2016).  The patient was given clear instructions to go to ER or return to medical center if symptoms don't improve, worsen or new problems develop. The patient verbalized understanding.    Micheline Chapman FNP  03/11/2016, 10:47 AM

## 2016-04-30 ENCOUNTER — Ambulatory Visit: Payer: Self-pay | Admitting: Neurology

## 2016-05-18 ENCOUNTER — Ambulatory Visit: Payer: Self-pay | Admitting: Diagnostic Neuroimaging

## 2016-05-19 ENCOUNTER — Telehealth: Payer: Self-pay | Admitting: *Deleted

## 2016-05-19 ENCOUNTER — Encounter: Payer: Self-pay | Admitting: Diagnostic Neuroimaging

## 2016-05-19 NOTE — Telephone Encounter (Signed)
error 

## 2016-05-27 ENCOUNTER — Ambulatory Visit: Payer: Medicaid Other | Admitting: Diagnostic Neuroimaging

## 2016-06-10 ENCOUNTER — Ambulatory Visit: Payer: Medicaid Other | Admitting: Family Medicine

## 2016-07-20 ENCOUNTER — Ambulatory Visit: Payer: Medicaid Other | Admitting: Family Medicine

## 2016-09-15 ENCOUNTER — Ambulatory Visit: Payer: Medicaid Other | Admitting: Family Medicine

## 2016-09-16 ENCOUNTER — Ambulatory Visit: Payer: Medicaid Other | Admitting: Family Medicine

## 2017-03-02 DIAGNOSIS — R9431 Abnormal electrocardiogram [ECG] [EKG]: Secondary | ICD-10-CM | POA: Insufficient documentation

## 2023-05-12 DIAGNOSIS — S0990XA Unspecified injury of head, initial encounter: Secondary | ICD-10-CM | POA: Diagnosis not present

## 2024-02-24 ENCOUNTER — Ambulatory Visit

## 2024-02-24 ENCOUNTER — Telehealth (HOSPITAL_BASED_OUTPATIENT_CLINIC_OR_DEPARTMENT_OTHER): Payer: Self-pay | Admitting: Family Medicine

## 2024-02-24 DIAGNOSIS — N938 Other specified abnormal uterine and vaginal bleeding: Secondary | ICD-10-CM | POA: Insufficient documentation

## 2024-02-24 DIAGNOSIS — F32A Depression, unspecified: Secondary | ICD-10-CM | POA: Insufficient documentation

## 2024-02-24 NOTE — Telephone Encounter (Signed)
 Copied from CRM [id number]. Topic: Appointments - Scheduling Inquiry for Clinic >> Feb 24, 2024  8:23 AM Charlet HERO wrote: Reason for CRM: Patient is calling to reschedule appt for today she would like to get an appt for next wed around 11, got error message and was not able to reschedule was able to cancel the appt for this morning please call patient to schedule.

## 2024-03-08 ENCOUNTER — Ambulatory Visit (HOSPITAL_BASED_OUTPATIENT_CLINIC_OR_DEPARTMENT_OTHER): Admitting: Family Medicine

## 2024-03-15 ENCOUNTER — Encounter (HOSPITAL_BASED_OUTPATIENT_CLINIC_OR_DEPARTMENT_OTHER): Payer: Self-pay | Admitting: Family Medicine

## 2024-03-15 ENCOUNTER — Ambulatory Visit (HOSPITAL_BASED_OUTPATIENT_CLINIC_OR_DEPARTMENT_OTHER): Admitting: Family Medicine

## 2024-03-15 ENCOUNTER — Encounter (HOSPITAL_BASED_OUTPATIENT_CLINIC_OR_DEPARTMENT_OTHER): Payer: Self-pay

## 2024-03-15 VITALS — BP 113/79 | HR 74 | Temp 98.3°F | Resp 16 | Ht 62.21 in | Wt 154.0 lb

## 2024-03-15 DIAGNOSIS — L84 Corns and callosities: Secondary | ICD-10-CM | POA: Insufficient documentation

## 2024-03-15 DIAGNOSIS — F419 Anxiety disorder, unspecified: Secondary | ICD-10-CM | POA: Diagnosis not present

## 2024-03-15 DIAGNOSIS — E785 Hyperlipidemia, unspecified: Secondary | ICD-10-CM | POA: Insufficient documentation

## 2024-03-15 DIAGNOSIS — I10 Essential (primary) hypertension: Secondary | ICD-10-CM | POA: Diagnosis not present

## 2024-03-15 DIAGNOSIS — N898 Other specified noninflammatory disorders of vagina: Secondary | ICD-10-CM | POA: Diagnosis not present

## 2024-03-15 DIAGNOSIS — Z1231 Encounter for screening mammogram for malignant neoplasm of breast: Secondary | ICD-10-CM

## 2024-03-15 DIAGNOSIS — Z1211 Encounter for screening for malignant neoplasm of colon: Secondary | ICD-10-CM

## 2024-03-15 MED ORDER — ESCITALOPRAM OXALATE 10 MG PO TABS: 10.0000 mg | ORAL_TABLET | Freq: Every day | ORAL | 2 refills | Status: DC

## 2024-03-15 NOTE — Assessment & Plan Note (Signed)
 Unclear why she isn't on a statin.  I'll request old records, but its certainly likely that I'll want to resume one at her next visit.

## 2024-03-15 NOTE — Assessment & Plan Note (Addendum)
 Controlled.  Continue current rx.  DASH diet.

## 2024-03-15 NOTE — Assessment & Plan Note (Signed)
 Consider HRT only after Mammogram reviewed.

## 2024-03-15 NOTE — Assessment & Plan Note (Signed)
 Counseling declined.  Extended discussion about benefits and possible side effects of her new medication.

## 2024-03-15 NOTE — Progress Notes (Signed)
 New Patient Office Visit  Subjective    Patient ID: Kaitlyn Gilbert, female    DOB: 02-Apr-1971  Age: 53 y.o. MRN: [id number]  CC:  Chief Complaint  Patient presents with   Establish Care    Establish care     HPI Kaitlyn Gilbert presents to establish care F/u as above.  New to [facility] primary care.  She has developed a corn along her 4th lateral toe that is uncomfortable.  She also states her anxiety level has been somewhat high after the recent unexpected death of a friend.  She politely declines counseling today, but does request medication.  Also frustrated with vaginal dryness despite using OTC lubricant.  She admits she is due for a Mammogram.  Discussed numerous issues today.  Outpatient Encounter Medications as of 03/15/2024  Medication Sig   amLODipine (NORVASC) 10 MG tablet Take 10 mg by mouth daily.   aspirin  EC 81 MG tablet Take 81 mg by mouth daily. Swallow whole.   escitalopram  (LEXAPRO ) 10 MG tablet Take 1 tablet (10 mg total) by mouth daily.   ferrous sulfate  325 (65 FE) MG tablet Take 1 tablet (325 mg total) by mouth 3 (three) times daily with meals.   lisinopril  (PRINIVIL ,ZESTRIL ) 20 MG tablet Take 1 tablet (20 mg total) by mouth daily.   lisinopril -hydrochlorothiazide (ZESTORETIC) 20-12.5 MG tablet Take 1 tablet by mouth 2 (two) times daily.   atorvastatin  (LIPITOR) 10 MG tablet Take 1 tablet (10 mg total) by mouth daily at 6 PM. (Patient not taking: Reported on 03/15/2024)   docusate sodium  (COLACE) 100 MG capsule Take 1 capsule (100 mg total) by mouth 2 (two) times daily. (Patient not taking: Reported on 03/15/2024)   [DISCONTINUED] apixaban  (ELIQUIS ) 5 MG TABS tablet Take 1 tablet (5 mg total) by mouth 2 (two) times daily. (Patient not taking: Reported on 03/15/2024)   No facility-administered encounter medications on file as of 03/15/2024.    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    Former smoker    Hypertension    Overweight     Past Surgical History:  Procedure Laterality Date   CESAREAN SECTION N/A    X 2   HYSTERECTOMY ABDOMINAL WITH SALPINGO-OOPHORECTOMY N/A    for noncancerous reasons    Family History  Problem Relation Age of Onset   Hypertension Mother    Cancer Maternal Grandmother     Social History   Tobacco Use   Smoking status: Former    Current packs/day: 0.00    Types: Cigarettes    Quit date: 02/07/2016    Years since quitting: 8.1   Smokeless tobacco: Never  Substance Use Topics   Alcohol use: No   Drug use: No    Review of Systems  Constitutional:  Negative for malaise/fatigue and weight loss.  Cardiovascular:  Negative for chest pain and palpitations.  Neurological:  Negative for dizziness.  Psychiatric/Behavioral:  Positive for depression. Negative for hallucinations, memory loss, substance abuse and suicidal ideas. The patient is nervous/anxious. The patient does not have insomnia.         Objective    BP 113/79 (BP Location: Right Arm, Patient Position: Sitting, Cuff Size: Normal)   Pulse 74   Temp 98.3 F (36.8 C) (Oral)   Resp 16   Ht 5' 2.21 (1.58 m)   Wt 154 lb (69.9 kg)   LMP 02/25/2016  SpO2 96%   BMI 27.98 kg/m   Physical Exam Constitutional:      General: She is not in acute distress.    Appearance: Normal appearance.  HENT:     Head: Normocephalic.  Neck:     Vascular: No carotid bruit.  Cardiovascular:     Rate and Rhythm: Normal rate and regular rhythm.     Pulses: Normal pulses.     Heart sounds: Normal heart sounds.  Pulmonary:     Effort: Pulmonary effort is normal.     Breath sounds: Normal breath sounds.  Abdominal:     General: Bowel sounds are normal.     Palpations: Abdomen is soft.  Musculoskeletal:     Cervical back: Neck supple. No tenderness.     Right lower leg: No edema.     Left lower leg: No edema.  Neurological:     Mental Status: She is alert.          Assessment & Plan:  Primary hypertension Assessment & Plan: Controlled.  Continue current rx.  DASH diet.   Breast cancer screening by mammogram -     3D Screening Mammogram, Left and Right; Future  Colon cancer screening -     Ambulatory referral to Gastroenterology  Anxiety Assessment & Plan: Counseling declined.  Extended discussion about benefits and possible side effects of her new medication.  Orders: -     Escitalopram  Oxalate; Take 1 tablet (10 mg total) by mouth daily.  Dispense: 30 tablet; Refill: 2  Dyslipidemia Assessment & Plan: Unclear why she isn't on a statin.  I'll request old records, but its certainly likely that I'll want to resume one at her next visit.   Vaginal dryness Assessment & Plan: Consider HRT only after Mammogram reviewed.   Corn of toe Assessment & Plan: Advised a corn pad.  Offered podiatry referral, but she declines for now.     Return in about 4 weeks (around 04/12/2024) for chronic follow-up.   REDDING PONCE NORLEEN FALCON., MD

## 2024-03-15 NOTE — Assessment & Plan Note (Signed)
 Advised a corn pad.  Offered podiatry referral, but she declines for now.

## 2024-03-30 ENCOUNTER — Encounter (HOSPITAL_BASED_OUTPATIENT_CLINIC_OR_DEPARTMENT_OTHER): Payer: Self-pay | Admitting: Radiology

## 2024-03-30 ENCOUNTER — Ambulatory Visit (INDEPENDENT_AMBULATORY_CARE_PROVIDER_SITE_OTHER)
Admission: RE | Admit: 2024-03-30 | Discharge: 2024-03-30 | Disposition: A | Discharge status: Home / Self Care | Source: Ambulatory Visit | Attending: Family Medicine | Admitting: Family Medicine

## 2024-03-30 DIAGNOSIS — Z1231 Encounter for screening mammogram for malignant neoplasm of breast: Secondary | ICD-10-CM

## 2024-04-19 ENCOUNTER — Ambulatory Visit (HOSPITAL_BASED_OUTPATIENT_CLINIC_OR_DEPARTMENT_OTHER): Admitting: Family Medicine

## 2024-04-26 ENCOUNTER — Encounter (HOSPITAL_BASED_OUTPATIENT_CLINIC_OR_DEPARTMENT_OTHER): Payer: Self-pay | Admitting: Family Medicine

## 2024-04-26 ENCOUNTER — Ambulatory Visit (INDEPENDENT_AMBULATORY_CARE_PROVIDER_SITE_OTHER): Admitting: Family Medicine

## 2024-04-26 VITALS — BP 128/85 | HR 86 | Temp 98.2°F | Resp 16 | Ht 62.21 in | Wt 155.8 lb

## 2024-04-26 DIAGNOSIS — E785 Hyperlipidemia, unspecified: Secondary | ICD-10-CM | POA: Diagnosis not present

## 2024-04-26 DIAGNOSIS — I10 Essential (primary) hypertension: Secondary | ICD-10-CM | POA: Diagnosis not present

## 2024-04-26 DIAGNOSIS — Z8673 Personal history of transient ischemic attack (TIA), and cerebral infarction without residual deficits: Secondary | ICD-10-CM | POA: Insufficient documentation

## 2024-04-26 MED ORDER — AMLODIPINE BESYLATE 10 MG PO TABS: 10.0000 mg | ORAL_TABLET | Freq: Every day | ORAL | 1 refills | Status: DC

## 2024-04-26 MED ORDER — LISINOPRIL-HYDROCHLOROTHIAZIDE 20-12.5 MG PO TABS: 1.0000 | ORAL_TABLET | Freq: Two times a day (BID) | ORAL | 1 refills | Status: DC

## 2024-04-26 MED ORDER — ROSUVASTATIN CALCIUM 10 MG PO TABS: 10.0000 mg | ORAL_TABLET | Freq: Every day | ORAL | 3 refills | Status: AC

## 2024-04-26 NOTE — Assessment & Plan Note (Signed)
 Resume statin.  I'll make sure she can tolerate the lower dosage of Rosuvastatin before pushing the dosage.

## 2024-04-26 NOTE — Assessment & Plan Note (Signed)
Nicely controlled.   

## 2024-04-26 NOTE — Progress Notes (Signed)
 Established Patient Office Visit  Subjective   Patient ID: Kaitlyn Gilbert, female    DOB: 03/04/1971  Age: 53 y.o. MRN: [id number]  Chief Complaint  Patient presents with   Medical Management of Chronic Issues    follow up     F/u as above.  Please see last note for details.  She states she experiences occasional foot numbness associated with standing on a concrete floor all night while working at the [employer].  Otherwise she denies any persisting deficits from her stroke.  It remains very unclear why a previous doctor stopped her statin.  She denies any side effects from the Atorvastatin .  She feels well.  Upcoming plans for a Colonoscopy.    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 Swallow whole.   rosuvastatin (CRESTOR) 10 MG tablet Take 1 tablet (10 mg total) by mouth daily.   [DISCONTINUED] amLODipine (NORVASC) 10 MG tablet Take 10 mg by mouth daily.   [DISCONTINUED] docusate sodium  (COLACE) 100 MG capsule Take 1 capsule (100 mg total) by mouth 2 (two) times daily.   [DISCONTINUED] escitalopram  (LEXAPRO ) 10 MG tablet Take 1 tablet (10 mg total) by mouth daily.   [DISCONTINUED] ferrous sulfate  325 (65 FE) MG tablet Take 1 tablet (325 mg total) by mouth 3 (three) times daily with meals. (Patient taking differently: Take 325 mg by mouth once a week.)   [DISCONTINUED] lisinopril  (PRINIVIL ,ZESTRIL ) 20 MG tablet Take 1 tablet (20 mg total) by mouth daily.   [DISCONTINUED] lisinopril -hydrochlorothiazide (ZESTORETIC) 20-12.5 MG tablet Take 1 tablet by mouth 2 (two) times daily.   amLODipine (NORVASC) 10 MG tablet Take 1 tablet (10 mg total) by mouth daily.   lisinopril -hydrochlorothiazide (ZESTORETIC) 20-12.5 MG tablet Take 1 tablet by mouth 2 (two)  times daily.   [DISCONTINUED] atorvastatin  (LIPITOR) 10 MG tablet Take 1 tablet (10 mg total) by mouth daily at 6 PM. (Patient not taking: Reported on 04/26/2024)   No facility-administered encounter medications on file as of 04/26/2024.    Social History   Tobacco Use   Smoking status: Former    Current packs/day: 0.00    Average packs/day: 0.3 packs/day for 26.6 years (6.7 ttl pk-yrs)    Types: Cigarettes    Start date: 46    Quit date: 02/07/2016    Years since quitting: 8.2   Smokeless tobacco: Never  Vaping Use   Vaping status: Never Used  Substance Use Topics   Alcohol use: Yes    Comment: occasionally   Drug use: No      Review of Systems  Constitutional:  Negative for diaphoresis, fever, malaise/fatigue and weight loss.  Respiratory:  Negative for cough, shortness of breath and wheezing.   Cardiovascular:  Negative for chest pain, palpitations, orthopnea, claudication, leg swelling and PND.      Objective:     BP 128/85   Pulse 86   Temp 98.2 F (36.8 C) (Oral)   Resp 16   Ht 5' 2.21 (1.58 m)   Wt 155 lb 12.8 oz (70.7 kg)   LMP 02/25/2016   SpO2 97%   BMI 28.30 kg/m    Physical Exam Constitutional:      General: She is not  in acute distress.    Appearance: Normal appearance.  HENT:     Head: Normocephalic.  Neck:     Vascular: No carotid bruit.  Cardiovascular:     Rate and Rhythm: Normal rate and regular rhythm.     Pulses: Normal pulses.     Heart sounds: Normal heart sounds.  Pulmonary:     Effort: Pulmonary effort is normal.     Breath sounds: Normal breath sounds.  Abdominal:     General: Bowel sounds are normal.     Palpations: Abdomen is soft.  Musculoskeletal:     Cervical back: Neck supple. No tenderness.     Right lower leg: No edema.     Left lower leg: No edema.  Neurological:     General: No focal deficit present.     Mental Status: She is alert.      No results found for any visits on 04/26/24.    The ASCVD Risk  score (Arnett DK, et al., 2019) failed to calculate for the following reasons:   Risk score cannot be calculated because patient has a medical history suggesting prior/existing ASCVD    Assessment & Plan:  Dyslipidemia Assessment & Plan: Resume statin.  I'll make sure she can tolerate the lower dosage of Rosuvastatin before pushing the dosage.  Orders: -     Rosuvastatin Calcium ; Take 1 tablet (10 mg total) by mouth daily.  Dispense: 30 tablet; Refill: 3  Primary hypertension Assessment & Plan: Nicely controlled.  Orders: -     amLODIPine Besylate; Take 1 tablet (10 mg total) by mouth daily.  Dispense: 90 tablet; Refill: 1 -     Lisinopril -hydroCHLOROthiazide; Take 1 tablet by mouth 2 (two) times daily.  Dispense: 180 tablet; Refill: 1  History of stroke    Return in about 3 months (around 07/27/2024) for chronic follow-up.    REDDING PONCE NORLEEN FALCON., MD

## 2024-04-26 NOTE — Patient Instructions (Signed)
 Please schedule papsmear with a GYN.

## 2024-04-27 ENCOUNTER — Telehealth (HOSPITAL_BASED_OUTPATIENT_CLINIC_OR_DEPARTMENT_OTHER): Payer: Self-pay | Admitting: *Deleted

## 2024-04-27 NOTE — Telephone Encounter (Signed)
Pt. Made aware of message.

## 2024-07-27 ENCOUNTER — Ambulatory Visit (INDEPENDENT_AMBULATORY_CARE_PROVIDER_SITE_OTHER): Admitting: Family Medicine

## 2024-07-27 ENCOUNTER — Encounter (HOSPITAL_BASED_OUTPATIENT_CLINIC_OR_DEPARTMENT_OTHER): Payer: Self-pay | Admitting: Family Medicine

## 2024-07-27 VITALS — BP 112/76 | HR 81 | Temp 98.5°F | Resp 16 | Wt 158.0 lb

## 2024-07-27 DIAGNOSIS — I10 Essential (primary) hypertension: Secondary | ICD-10-CM

## 2024-07-27 DIAGNOSIS — R5383 Other fatigue: Secondary | ICD-10-CM

## 2024-07-27 DIAGNOSIS — E785 Hyperlipidemia, unspecified: Secondary | ICD-10-CM

## 2024-07-27 MED ORDER — AMLODIPINE BESYLATE 10 MG PO TABS: 10.0000 mg | ORAL_TABLET | Freq: Every day | ORAL | 1 refills | Status: AC

## 2024-07-27 MED ORDER — LISINOPRIL-HYDROCHLOROTHIAZIDE 20-12.5 MG PO TABS: 1.0000 | ORAL_TABLET | Freq: Two times a day (BID) | ORAL | 1 refills | Status: AC

## 2024-07-27 NOTE — Assessment & Plan Note (Addendum)
 Low fat diet. Continue Rosuvastatin . Orders:   Lipid panel

## 2024-07-27 NOTE — Assessment & Plan Note (Addendum)
 Blood pressure is well-controlled at 112/80 mmHg. Current medication regimen is effective. Discussed potential for a medication decrease if blood pressure consistently falls below 120/80 mmHg. - Continue current antihypertensive medications Orders:   lisinopril -hydrochlorothiazide  (ZESTORETIC ) 20-12.5 MG tablet; Take 1 tablet by mouth 2 (two) times daily.   amLODipine  (NORVASC ) 10 MG tablet; Take 1 tablet (10 mg total) by mouth daily.   CBC with Differential/Platelet   Comprehensive metabolic panel with GFR

## 2024-07-27 NOTE — Progress Notes (Signed)
 "  Established Patient Office Visit  Subjective   Patient ID: Kaitlyn Gilbert, female    DOB: 07-01-1970  Age: 54 y.o. MRN: [id number]  Chief Complaint  Patient presents with   Follow-up    Follow-up    Discussed the use of AI scribe software for clinical note transcription with the patient, who gave verbal consent to proceed.  History of Present Illness Kaitlyn Gilbert is a 54 year old female with hypertension who presents for medication refills and routine follow-up.  She is currently on multiple medications for hypertension, though specific medications and dosages were not discussed. Her blood pressure readings have been good, with her systolic blood pressure as low as 112 mmHg.  She inquired about diet pills, specifically mentioning '[place] something', but was informed that phentermine is not suitable for her due to its potential to elevate blood pressure and increase stroke risk. She is continuing her diet and exercise regimen.  She had her last blood work approximately eight to nine months ago after leaving her previous doctor.  She had a recent mammogram, though the exact date was not specified, and she has an upcoming appointment for a colonoscopy.    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 Swallow whole.   rosuvastatin  (CRESTOR ) 10 MG tablet Take 1 tablet (10 mg total) by mouth daily.   amLODipine  (NORVASC ) 10 MG tablet Take 1 tablet (10 mg total) by mouth daily.   lisinopril -hydrochlorothiazide  (ZESTORETIC ) 20-12.5 MG tablet Take 1 tablet by mouth 2 (two) times daily.   [DISCONTINUED] amLODipine  (NORVASC ) 10 MG tablet Take 1 tablet (10 mg total) by mouth daily.   [DISCONTINUED] lisinopril -hydrochlorothiazide  (ZESTORETIC ) 20-12.5 MG tablet  Take 1 tablet by mouth 2 (two) times daily.   No facility-administered encounter medications on file as of 07/27/2024.    Social History   Tobacco Use   Smoking status: Former    Current packs/day: 0.00    Average packs/day: 0.3 packs/day for 26.6 years (6.7 ttl pk-yrs)    Types: Cigarettes    Start date: 32    Quit date: 02/07/2016    Years since quitting: 8.4   Smokeless tobacco: Never  Vaping Use   Vaping status: Never Used  Substance Use Topics   Alcohol use: Yes    Comment: occasionally   Drug use: No      ROS    Objective:     BP 112/76 (Cuff Size: Normal)   Pulse 81   Temp 98.5 F (36.9 C) (Oral)   Resp 16   Wt 158 lb (71.7 kg)   LMP 02/25/2016   SpO2 92%   BMI 28.70 kg/m    Physical Exam Constitutional:      General: She is not in acute distress.    Appearance: Normal appearance.  HENT:     Head: Normocephalic.  Neck:     Vascular: No carotid bruit.  Cardiovascular:     Rate and Rhythm: Normal rate and regular rhythm.     Pulses: Normal pulses.     Heart sounds: Normal heart sounds.  Pulmonary:     Effort: Pulmonary effort is normal.     Breath sounds: Normal breath sounds.  Abdominal:  General: Bowel sounds are normal.     Palpations: Abdomen is soft.  Musculoskeletal:     Cervical back: Neck supple. No tenderness.     Right lower leg: No edema.     Left lower leg: No edema.  Neurological:     Mental Status: She is alert.      No results found for any visits on 07/27/24.    The ASCVD Risk score (Arnett DK, et al., 2019) failed to calculate for the following reasons:   Risk score cannot be calculated because patient has a medical history suggesting prior/existing ASCVD   * - Cholesterol units were assumed    Assessment & Plan:   Assessment & Plan Primary hypertension Blood pressure is well-controlled at 112/80 mmHg. Current medication regimen is effective. Discussed potential for a medication decrease if blood pressure  consistently falls below 120/80 mmHg. - Continue current antihypertensive medications Orders:   lisinopril -hydrochlorothiazide  (ZESTORETIC ) 20-12.5 MG tablet; Take 1 tablet by mouth 2 (two) times daily.   amLODipine  (NORVASC ) 10 MG tablet; Take 1 tablet (10 mg total) by mouth daily.   CBC with Differential/Platelet   Comprehensive metabolic panel with GFR  Dyslipidemia Low fat diet. Continue Rosuvastatin . Orders:   Lipid panel  Fatigue, unspecified type Mild.  Reevaluate labs. Orders:   TSH   Vitamin B12      Return in about 6 months (around 01/24/2025) for chronic follow-up.    REDDING PONCE NORLEEN FALCON., MD  "

## 2024-07-28 ENCOUNTER — Ambulatory Visit (HOSPITAL_BASED_OUTPATIENT_CLINIC_OR_DEPARTMENT_OTHER): Payer: Self-pay | Admitting: Family Medicine

## 2024-07-28 LAB — TSH: TSH: 1.44 u[IU]/mL (ref 0.450–4.500)

## 2024-07-28 LAB — COMPREHENSIVE METABOLIC PANEL WITH GFR
ALT: 21 [IU]/L (ref 0–32)
AST: 15 [IU]/L (ref 0–40)
Albumin: 4.4 g/dL (ref 3.8–4.9)
Alkaline Phosphatase: 82 [IU]/L (ref 49–135)
BUN/Creatinine Ratio: 23 (ref 9–23)
BUN: 20 mg/dL (ref 6–24)
Bilirubin Total: 0.6 mg/dL (ref 0.0–1.2)
CO2: 22 mmol/L (ref 20–29)
Calcium: 9.9 mg/dL (ref 8.7–10.2)
Chloride: 104 mmol/L (ref 96–106)
Creatinine, Ser: 0.86 mg/dL (ref 0.57–1.00)
Globulin, Total: 2.8 g/dL (ref 1.5–4.5)
Glucose: 111 mg/dL — ABNORMAL HIGH (ref 70–99)
Potassium: 3.8 mmol/L (ref 3.5–5.2)
Sodium: 140 mmol/L (ref 134–144)
Total Protein: 7.2 g/dL (ref 6.0–8.5)
eGFR: 81 mL/min/{1.73_m2}

## 2024-07-28 LAB — CBC WITH DIFFERENTIAL/PLATELET
Basophils Absolute: 0 10*3/uL (ref 0.0–0.2)
Basos: 1 %
EOS (ABSOLUTE): 0.1 10*3/uL (ref 0.0–0.4)
Eos: 1 %
Hematocrit: 41.4 % (ref 34.0–46.6)
Hemoglobin: 13.9 g/dL (ref 11.1–15.9)
Immature Grans (Abs): 0 10*3/uL (ref 0.0–0.1)
Immature Granulocytes: 0 %
Lymphocytes Absolute: 2 10*3/uL (ref 0.7–3.1)
Lymphs: 36 %
MCH: 31.6 pg (ref 26.6–33.0)
MCHC: 33.6 g/dL (ref 31.5–35.7)
MCV: 94 fL (ref 79–97)
Monocytes Absolute: 0.5 10*3/uL (ref 0.1–0.9)
Monocytes: 8 %
Neutrophils Absolute: 3 10*3/uL (ref 1.4–7.0)
Neutrophils: 54 %
Platelets: 240 10*3/uL (ref 150–450)
RBC: 4.4 x10E6/uL (ref 3.77–5.28)
RDW: 13.1 % (ref 11.7–15.4)
WBC: 5.6 10*3/uL (ref 3.4–10.8)

## 2024-07-28 LAB — LIPID PANEL
Chol/HDL Ratio: 3.5 ratio (ref 0.0–4.4)
Cholesterol, Total: 169 mg/dL (ref 100–199)
HDL: 48 mg/dL
LDL Chol Calc (NIH): 91 mg/dL (ref 0–99)
Triglycerides: 174 mg/dL — ABNORMAL HIGH (ref 0–149)
VLDL Cholesterol Cal: 30 mg/dL (ref 5–40)

## 2024-07-28 LAB — VITAMIN B12: Vitamin B-12: 1225 pg/mL (ref 232–1245)

## 2025-02-05 ENCOUNTER — Ambulatory Visit (HOSPITAL_BASED_OUTPATIENT_CLINIC_OR_DEPARTMENT_OTHER): Admitting: Family Medicine
